# Patient Record
Sex: Male | Born: 2000
Health system: Southern US, Community
[De-identification: ages and names within clinical notes are randomized; demographics above are authoritative.]

## PROBLEM LIST (undated history)

## (undated) DIAGNOSIS — F909 Attention-deficit hyperactivity disorder, unspecified type: Secondary | ICD-10-CM

## (undated) HISTORY — PX: WISDOM TOOTH EXTRACTION: SHX21

---
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
## 2018-05-14 ENCOUNTER — Encounter: Payer: Self-pay | Admitting: Family Medicine

## 2018-05-14 ENCOUNTER — Ambulatory Visit (INDEPENDENT_AMBULATORY_CARE_PROVIDER_SITE_OTHER): Payer: BLUE CROSS/BLUE SHIELD | Admitting: Family Medicine

## 2018-05-14 VITALS — BP 110/70 | HR 76 | Temp 98.4°F | Ht 68.5 in | Wt 182.6 lb

## 2018-05-14 DIAGNOSIS — Z0000 Encounter for general adult medical examination without abnormal findings: Secondary | ICD-10-CM

## 2018-05-14 NOTE — Patient Instructions (Signed)
Consider HPV vaccine-check on insurance coverage and if interested may schedule as nurse visit.  This is a 3 shot series divided -and given over 6 months

## 2018-05-14 NOTE — Progress Notes (Signed)
  Subjective:     Patient ID: Sean CrumbJohn Fouch Schmidt, male   DOB: 10/25/2001, 17 y.o.   MRN: [id number]  HPI Patient here for physical exam. He is preparing to start [employer] for [employer] in the fall. Hopes to major in history. He has no chronic medical problems. Immunizations reviewed. They.are up-to-date with exception of no history of HPV.Marland Kitchen. He will need tetanus booster in a couple of years.  Past Medical History:  Diagnosis Date  . ADHD (attention deficit hyperactivity disorder)    Past Surgical History:  Procedure Laterality Date  . WISDOM TOOTH EXTRACTION Bilateral     reports that he has never smoked. He has never used smokeless tobacco. He reports that he does not drink alcohol or use drugs. family history includes Hypertension in his father and mother. No Known Allergies   Review of Systems  Constitutional: Negative for activity change, appetite change, fatigue and fever.  HENT: Negative for congestion, ear pain and trouble swallowing.   Eyes: Negative for pain and visual disturbance.  Respiratory: Negative for cough, shortness of breath and wheezing.   Cardiovascular: Negative for chest pain and palpitations.  Gastrointestinal: Negative for abdominal distention, abdominal pain, blood in stool, constipation, diarrhea, nausea, rectal pain and vomiting.  Genitourinary: Negative for dysuria, hematuria and testicular pain.  Musculoskeletal: Negative for arthralgias and joint swelling.  Skin: Negative for rash.  Neurological: Negative for dizziness, syncope and headaches.  Hematological: Negative for adenopathy.  Psychiatric/Behavioral: Negative for confusion and dysphoric mood.       Objective:   Physical Exam  Constitutional: He is oriented to person, place, and time. He appears well-developed and well-nourished. No distress.  HENT:  Head: Normocephalic and atraumatic.  Right Ear: External ear normal.  Left Ear: External ear normal.  Mouth/Throat: Oropharynx is clear and  moist.  Eyes: Pupils are equal, round, and reactive to light. Conjunctivae and EOM are normal.  Neck: Normal range of motion. Neck supple. No thyromegaly present.  Cardiovascular: Normal rate, regular rhythm and normal heart sounds.  No murmur heard. Pulmonary/Chest: No respiratory distress. He has no wheezes. He has no rales.  Abdominal: Soft. Bowel sounds are normal. He exhibits no distension and no mass. There is no tenderness. There is no rebound and no guarding.  Musculoskeletal: He exhibits no edema.  Lymphadenopathy:    He has no cervical adenopathy.  Neurological: He is alert and oriented to person, place, and time. He displays normal reflexes. No cranial nerve deficit.  Skin: No rash noted.  Psychiatric: He has a normal mood and affect.       Assessment:     Physical exam. We addressed the following    Plan:     -Immunizations reviewed. We discussed HPV and they will check with insurance and consider getting back for that. -Other immunizations appear up-to-date  Kristian CoveyBruce W Burchette MD [facility]

## 2018-06-02 ENCOUNTER — Telehealth: Payer: Self-pay | Admitting: *Deleted

## 2018-06-02 NOTE — Telephone Encounter (Signed)
Copied from CRM [id number]. Topic: Inquiry >> Jun 02, 2018 12:23 PM Crist InfanteHarrald, Kathy J wrote: Reason for CRM:  dad called because pt's immunization record he received from our office for his Hep B is incorrect. The immunization form he received states these were done 3 days apart.  "Hepatitis B 02/26/2000, 02/23/2000, 08[birth year]12/1999"  BUT the original from his pediatrician states the date were 11/28/1999  and 02/26/2000  The college is not accepting this form, and not allowing him to register, so dad is hoping we can get this corrected asap so that pt can get in [employer] [employer] [facility] [employer].

## 2018-06-02 NOTE — Telephone Encounter (Signed)
Reviewed [facility] and dates are listed as stated below. Do you happen to have a copy of the records from old PCP? There no vaccine records scanned in but I didn't know if maybe they brought records to last appt.

## 2018-06-02 NOTE — Telephone Encounter (Signed)
Immunization record corrected and new record ready to pick up.  Patient is aware.

## 2019-05-27 ENCOUNTER — Telehealth: Payer: Self-pay | Admitting: *Deleted

## 2019-05-27 DIAGNOSIS — Z20822 Contact with and (suspected) exposure to covid-19: Secondary | ICD-10-CM

## 2019-05-27 NOTE — Telephone Encounter (Addendum)
Charted In error; duplicate ----- Message from Hulda Humphrey, [place] sent at 05/26/2019  4:06 PM EDT ----- Regarding: Sean Schmidt would like this pt tested for Covid-19.  He has had exposure.

## 2019-05-27 NOTE — Telephone Encounter (Addendum)
Spoke with pt's father, Bacilio, to schedule COVID testing; he accepted appointment at [facility] site 05/30/2019 at 67 on behalf of the pt; pt's father given address, location, and instructions that he and all occupants of his vehicle should wear masks; he verbalized understanding; orders placed per protocol. ----- Message from Hulda Humphrey, [place] sent at 05/26/2019  4:06 PM EDT ----- Regarding: Jackson Latino would like this pt tested for Covid-19.  He has had exposure.

## 2019-05-30 ENCOUNTER — Other Ambulatory Visit: Payer: BLUE CROSS/BLUE SHIELD

## 2019-05-30 ENCOUNTER — Telehealth: Payer: Self-pay | Admitting: Family Medicine

## 2019-05-30 DIAGNOSIS — Z20822 Contact with and (suspected) exposure to covid-19: Secondary | ICD-10-CM

## 2019-05-30 NOTE — Telephone Encounter (Signed)
Pts father state that he spoke with you and stated that you and Sean Schmidt took care of them with getting them tested for COVID today and pt needs a work note stating that he was under quarantine and when he can return to work.    Pts father would like to have a call when it is ready.

## 2019-05-31 ENCOUNTER — Encounter: Payer: Self-pay | Admitting: Family Medicine

## 2019-05-31 NOTE — Telephone Encounter (Signed)
Dad calling back to ask for the letter stating pt should be quarantined so he can get letter to employer. °Can you put on mychart? ° °

## 2019-05-31 NOTE — Telephone Encounter (Signed)
Spoke to [employer] and advised that I have sent a letter to [employer].  Nothing further needed.

## 2019-06-04 LAB — NOVEL CORONAVIRUS, NAA: SARS-CoV-2, NAA: NOT DETECTED

## 2020-06-26 ENCOUNTER — Ambulatory Visit: Payer: Self-pay | Admitting: Family Medicine

## 2020-06-26 ENCOUNTER — Other Ambulatory Visit: Payer: Self-pay

## 2020-06-26 ENCOUNTER — Encounter: Payer: Self-pay | Admitting: Family Medicine

## 2020-06-26 ENCOUNTER — Ambulatory Visit (INDEPENDENT_AMBULATORY_CARE_PROVIDER_SITE_OTHER): Payer: Managed Care, Other (non HMO) | Admitting: Family Medicine

## 2020-06-26 VITALS — BP 116/62 | HR 67 | Temp 97.9°F | Wt 200.4 lb

## 2020-06-26 DIAGNOSIS — R600 Localized edema: Secondary | ICD-10-CM | POA: Diagnosis not present

## 2020-06-26 DIAGNOSIS — R21 Rash and other nonspecific skin eruption: Secondary | ICD-10-CM | POA: Diagnosis not present

## 2020-06-26 NOTE — Progress Notes (Signed)
Established Patient Office Visit  Subjective:  Patient ID: Sean Schmidt, male    DOB: 2001/03/19  Age: 19 y.o. MRN: [id number]  CC:  Chief Complaint  Patient presents with  . Foot Swelling    in both feet pt thinks from over using at trampoline park sunday     HPI Sean Schmidt presents for the following acute items  He states that Sunday he spent about an hour jumping at a trampoline park.  Monday he woke up and had some bilateral foot edema.  This is mostly along the medial aspect of the foot.  Right greater than left.  No significant pain with ambulation.  Does not recall any specific injury.  No leg edema.  No calf pain.  No recent change of shoewear.  Second issue is scaly rash volar surface left wrist.  Came up several weeks ago.  He tried some type of prescription steroid cream but has not seen much improvement with that.  Moderate pruritus    Past Medical History:  Diagnosis Date  . ADHD (attention deficit hyperactivity disorder)     Past Surgical History:  Procedure Laterality Date  . WISDOM TOOTH EXTRACTION Bilateral     Family History  Problem Relation Age of Onset  . Hypertension Mother   . Hypertension Father     Social History   Socioeconomic History  . Marital status: Single    Spouse name: Not on file  . Number of children: Not on file  . Years of education: Not on file  . Highest education level: Not on file  Occupational History  . Not on file  Tobacco Use  . Smoking status: Never Smoker  . Smokeless tobacco: Never Used  Substance and Sexual Activity  . Alcohol use: No  . Drug use: No  . Sexual activity: Not on file  Other Topics Concern  . Not on file  Social History Narrative  . Not on file   Social Determinants of Health   Financial Resource Strain:   . Difficulty of Paying Living Expenses:   Food Insecurity:   . Worried About Programme researcher, broadcasting/film/video in the Last Year:   . Barista in the Last Year:   Transportation Needs:    . Freight forwarder (Medical):   Marland Kitchen Lack of Transportation (Non-Medical):   Physical Activity:   . Days of Exercise per Week:   . Minutes of Exercise per Session:   Stress:   . Feeling of Stress :   Social Connections:   . Frequency of Communication with Friends and Family:   . Frequency of Social Gatherings with Friends and Family:   . Attends Religious Services:   . Active Member of Clubs or Organizations:   . Attends Banker Meetings:   Marland Kitchen Marital Status:   Intimate Partner Violence:   . Fear of Current or Ex-Partner:   . Emotionally Abused:   Marland Kitchen Physically Abused:   . Sexually Abused:     Outpatient Medications Prior to Visit  Medication Sig Dispense Refill  . Aluminum Chloride POWD Apply to axilla once daily for perspiration control. (Patient not taking: Reported on 06/26/2020) 1 Bottle 0  . azithromycin (ZITHROMAX) 250 MG tablet Take 2 today and then one each day until gone. (Patient not taking: Reported on 06/26/2020) 6 tablet 0   No facility-administered medications prior to visit.    No Known Allergies  ROS Review of Systems  Respiratory: Negative for shortness of  breath.   Cardiovascular: Negative for chest pain.  Skin: Positive for rash.  Neurological: Negative for weakness.      Objective:    Physical Exam Vitals reviewed.  Constitutional:      Appearance: Normal appearance.  Cardiovascular:     Rate and Rhythm: Normal rate and regular rhythm.  Pulmonary:     Effort: Pulmonary effort is normal.     Breath sounds: Normal breath sounds.  Musculoskeletal:     Comments: Feet reveal no bony tenderness.  He has some slight soft tissue prominence on the medial/mid aspect of the right and left feet right greater than left.  No Achilles tenderness.  Full range of motion ankle.  No erythema.  Skin:    Comments: Left wrist volar surface distally has approximately 4 x 4 centimeter area of rash with slightly erythematous base and scaly surface.   Nontender.  Well demarcated border.  Neurological:     Mental Status: He is alert.     BP 116/62 (BP Location: Left Arm, Patient Position: Sitting, Cuff Size: Normal)   Pulse 67   Temp 97.9 F (36.6 C) (Oral)   Wt 200 lb 6.4 oz (90.9 kg)   SpO2 99%   BMI 30.03 kg/m  Wt Readings from Last 3 Encounters:  06/26/20 200 lb 6.4 oz (90.9 kg)  05/14/18 182 lb 9.6 oz (82.8 kg) (85 %, Z= 1.03)*  10/13/17 170 lb 2 oz (77.2 kg) (77 %, Z= 0.73)*   * Growth percentiles are based on CDC (Boys, 2-20 Years) data.     Health Maintenance Due  Topic Date Due  . Hepatitis C Screening  Never done  . HIV Screening  Never done  . INFLUENZA VACCINE  06/24/2020    There are no preventive care reminders to display for this patient.  Lab Results  Component Value Date   TSH 1.42 04/22/2016   Lab Results  Component Value Date   WBC 3.8 (L) 04/22/2016   HGB 14.0 04/22/2016   HCT 42.7 04/22/2016   MCV 84.6 04/22/2016   PLT 190.0 04/22/2016   Lab Results  Component Value Date   NA 140 04/22/2016   K 4.3 04/22/2016   CO2 30 04/22/2016   GLUCOSE 90 04/22/2016   BUN 12 04/22/2016   CREATININE 0.68 04/22/2016   BILITOT 0.8 05/26/2016   ALKPHOS 123 05/26/2016   AST 28 05/26/2016   ALT 25 05/26/2016   PROT 7.4 05/26/2016   ALBUMIN 4.5 05/26/2016   CALCIUM 10.1 04/22/2016   GFR 197.83 04/22/2016   Lab Results  Component Value Date   CHOL 186 04/22/2016   Lab Results  Component Value Date   HDL 47.70 04/22/2016   Lab Results  Component Value Date   LDLCALC 126 (H) 04/22/2016   Lab Results  Component Value Date   TRIG 63.0 04/22/2016   Lab Results  Component Value Date   CHOLHDL 4 04/22/2016   No results found for: HGBA1C    Assessment & Plan:   #1 mild localized foot edema.  This followed trampoline use.  Does not recall specific injury.  No evidence of bony injury  -Recommend he try some icing 15 to 20 minutes 2-3 times daily. -Consider compression wrap -Consider  sports medicine referral and couple weeks if not improving  #2 skin rash left wrist.  Question fungal.  He already tried prescription steroid cream without improvement.  Recommend trial of Lotrimin cream if not resolving over the next few weeks consider scraping and  KOH/culture.  No orders of the defined types were placed in this encounter.   Follow-up: No follow-ups on file.    Evelena Peat, MD

## 2020-06-26 NOTE — Patient Instructions (Signed)
Try some icing 15-20 minutes 2-3 times daily  Could look at some compression also  Let me know in 1-2 weeks if no better in terms of swelling and pain.

## 2020-10-22 ENCOUNTER — Encounter: Payer: Managed Care, Other (non HMO) | Admitting: Family Medicine

## 2020-11-02 ENCOUNTER — Ambulatory Visit (INDEPENDENT_AMBULATORY_CARE_PROVIDER_SITE_OTHER): Payer: PRIVATE HEALTH INSURANCE | Admitting: Family Medicine

## 2020-11-02 ENCOUNTER — Other Ambulatory Visit: Payer: Self-pay

## 2020-11-02 ENCOUNTER — Encounter: Payer: Self-pay | Admitting: Family Medicine

## 2020-11-02 VITALS — BP 110/80 | HR 66 | Temp 98.4°F | Ht 68.5 in | Wt 208.7 lb

## 2020-11-02 DIAGNOSIS — Z0000 Encounter for general adult medical examination without abnormal findings: Secondary | ICD-10-CM

## 2020-11-02 DIAGNOSIS — Z23 Encounter for immunization: Secondary | ICD-10-CM

## 2020-11-02 LAB — HEPATIC FUNCTION PANEL
ALT: 28 U/L (ref 0–53)
AST: 24 U/L (ref 0–37)
Albumin: 4.6 g/dL (ref 3.5–5.2)
Alkaline Phosphatase: 64 U/L (ref 39–117)
Bilirubin, Direct: 0.1 mg/dL (ref 0.0–0.3)
Total Bilirubin: 0.5 mg/dL (ref 0.2–1.2)
Total Protein: 7.4 g/dL (ref 6.0–8.3)

## 2020-11-02 LAB — CBC WITH DIFFERENTIAL/PLATELET
Basophils Absolute: 0 10*3/uL (ref 0.0–0.1)
Basophils Relative: 0.5 % (ref 0.0–3.0)
Eosinophils Absolute: 0.1 10*3/uL (ref 0.0–0.7)
Eosinophils Relative: 1.7 % (ref 0.0–5.0)
HCT: 45.7 % (ref 39.0–52.0)
Hemoglobin: 14.8 g/dL (ref 13.0–17.0)
Lymphocytes Relative: 41.5 % (ref 12.0–46.0)
Lymphs Abs: 1.4 10*3/uL (ref 0.7–4.0)
MCHC: 32.4 g/dL (ref 30.0–36.0)
MCV: 89 fl (ref 78.0–100.0)
Monocytes Absolute: 0.3 10*3/uL (ref 0.1–1.0)
Monocytes Relative: 9.8 % (ref 3.0–12.0)
Neutro Abs: 1.6 10*3/uL (ref 1.4–7.7)
Neutrophils Relative %: 46.5 % (ref 43.0–77.0)
Platelets: 173 10*3/uL (ref 150.0–400.0)
RBC: 5.13 Mil/uL (ref 4.22–5.81)
RDW: 13.3 % (ref 11.5–15.5)
WBC: 3.5 10*3/uL — ABNORMAL LOW (ref 4.0–10.5)

## 2020-11-02 LAB — BASIC METABOLIC PANEL
BUN: 23 mg/dL (ref 6–23)
CO2: 31 mEq/L (ref 19–32)
Calcium: 9.7 mg/dL (ref 8.4–10.5)
Chloride: 102 mEq/L (ref 96–112)
Creatinine, Ser: 0.99 mg/dL (ref 0.40–1.50)
GFR: 108.95 mL/min (ref >60.00)
Glucose, Bld: 95 mg/dL (ref 70–99)
Potassium: 4.3 mEq/L (ref 3.5–5.1)
Sodium: 140 mEq/L (ref 135–145)

## 2020-11-02 LAB — LIPID PANEL
Cholesterol: 180 mg/dL (ref 0–200)
HDL: 57.5 mg/dL (ref >39.00)
LDL Cholesterol: 114 mg/dL — ABNORMAL HIGH (ref 0–99)
NonHDL: 122.61
Total CHOL/HDL Ratio: 3
Triglycerides: 41 mg/dL (ref 0.0–149.0)
VLDL: 8.2 mg/dL (ref 0.0–40.0)

## 2020-11-02 LAB — TSH: TSH: 0.72 u[IU]/mL (ref 0.35–4.50)

## 2020-11-02 NOTE — Addendum Note (Signed)
Addended by: Scarlett Presto on: 11/02/2020 08:11 AM   Modules accepted: Orders

## 2020-11-02 NOTE — Patient Instructions (Signed)

## 2020-11-02 NOTE — Addendum Note (Signed)
Addended by: Lerry Liner on: 11/02/2020 08:09 AM   Modules accepted: Orders

## 2020-11-02 NOTE — Addendum Note (Signed)
Addended by: Lerry Liner on: 11/02/2020 08:14 AM   Modules accepted: Orders

## 2020-11-02 NOTE — Progress Notes (Signed)
Established Patient Office Visit  Subjective:  Patient ID: Sean Schmidt, male    DOB: 02/01/2001  Age: 19 y.o. MRN: [id number]  CC:  Chief Complaint  Patient presents with  . Annual Exam    HPI Sean Schmidt presents for ADD but no lower takes medications for this.  Generally healthy.  He is attending graduate school [employer] studying public history.  He hopes to finish 2023.  Also works at [employer] about 20 to 25 hours/week.  Stays very busy.  Health maintenance reviewed  -No history of flu vaccine this season.  He is willing to consider this. -Due for tetanus at this time -Covid vaccines complete -No history of hepatitis C screening but low risk  Social history-in grad school [employer] as above.  Studying public history.  Occasional vape but infrequently.  Infrequent alcohol use. Not sexually active currently  Family history-both parents have hypertension history.  Sister with asthma history  Past Medical History:  Diagnosis Date  . ADHD (attention deficit hyperactivity disorder)     Past Surgical History:  Procedure Laterality Date  . WISDOM TOOTH EXTRACTION Bilateral     Family History  Problem Relation Age of Onset  . Hypertension Mother   . Hypertension Father   . Asthma Sister     Social History   Socioeconomic History  . Marital status: Single    Spouse name: Not on file  . Number of children: Not on file  . Years of education: Not on file  . Highest education level: Not on file  Occupational History  . Not on file  Tobacco Use  . Smoking status: Never Smoker  . Smokeless tobacco: Never Used  Substance and Sexual Activity  . Alcohol use: No  . Drug use: No  . Sexual activity: Not on file  Other Topics Concern  . Not on file  Social History Narrative  . Not on file   Social Determinants of Health   Financial Resource Strain: Not on file  Food Insecurity: Not on file  Transportation Needs: Not on file  Physical Activity: Not on file   Stress: Not on file  Social Connections: Not on file  Intimate Partner Violence: Not on file    Outpatient Medications Prior to Visit  Medication Sig Dispense Refill  . Aluminum Chloride POWD Apply to axilla once daily for perspiration control. 1 Bottle 0  . azithromycin (ZITHROMAX) 250 MG tablet Take 2 today and then one each day until gone. (Patient not taking: Reported on 06/26/2020) 6 tablet 0   No facility-administered medications prior to visit.    No Known Allergies  ROS Review of Systems  Constitutional: Negative for activity change, appetite change, fatigue, fever and unexpected weight change.  HENT: Negative for congestion, ear pain and trouble swallowing.   Eyes: Negative for pain and visual disturbance.  Respiratory: Negative for cough, shortness of breath and wheezing.   Cardiovascular: Negative for chest pain and palpitations.  Gastrointestinal: Negative for abdominal distention, abdominal pain, blood in stool, constipation, diarrhea, nausea, rectal pain and vomiting.  Endocrine: Negative for polydipsia and polyuria.  Genitourinary: Negative for dysuria, hematuria and testicular pain.  Musculoskeletal: Negative for arthralgias and joint swelling.  Skin: Negative for rash.  Neurological: Negative for dizziness, syncope and headaches.  Hematological: Negative for adenopathy.  Psychiatric/Behavioral: Negative for confusion and dysphoric mood.      Objective:    Physical Exam Constitutional:      General: He is not in acute distress.  Appearance: He is well-developed and well-nourished.  HENT:     Head: Normocephalic and atraumatic.     Right Ear: External ear normal.     Left Ear: External ear normal.     Mouth/Throat:     Mouth: Oropharynx is clear and moist.  Eyes:     Extraocular Movements: EOM normal.     Conjunctiva/sclera: Conjunctivae normal.     Pupils: Pupils are equal, round, and reactive to light.  Neck:     Thyroid: No thyromegaly.   Cardiovascular:     Rate and Rhythm: Normal rate and regular rhythm.     Heart sounds: Normal heart sounds. No murmur heard.   Pulmonary:     Effort: No respiratory distress.     Breath sounds: No wheezing or rales.  Abdominal:     General: Bowel sounds are normal. There is no distension.     Palpations: Abdomen is soft. There is no mass.     Tenderness: There is no abdominal tenderness. There is no guarding or rebound.  Musculoskeletal:        General: No edema.     Cervical back: Normal range of motion and neck supple.  Lymphadenopathy:     Cervical: No cervical adenopathy.  Skin:    Findings: No rash.  Neurological:     Mental Status: He is alert and oriented to person, place, and time.     Cranial Nerves: No cranial nerve deficit.     Deep Tendon Reflexes: Reflexes normal.  Psychiatric:        Mood and Affect: Mood and affect normal.     BP 110/80 (BP Location: Right Arm, Patient Position: Sitting, Cuff Size: Normal)   Pulse 66   Temp 98.4 F (36.9 C) (Oral)   Ht 5' 8.5" (1.74 m)   Wt 208 lb 11.2 oz (94.7 kg)   SpO2 98%   BMI 31.27 kg/m  Wt Readings from Last 3 Encounters:  11/02/20 208 lb 11.2 oz (94.7 kg)  06/26/20 200 lb 6.4 oz (90.9 kg)  05/14/18 182 lb 9.6 oz (82.8 kg) (85 %, Z= 1.03)*   * Growth percentiles are based on CDC (Boys, 2-20 Years) data.     Health Maintenance Due  Topic Date Due  . Hepatitis C Screening  Never done  . HIV Screening  Never done  . TETANUS/TDAP  08/14/2020    There are no preventive care reminders to display for this patient.  Lab Results  Component Value Date   TSH 1.42 04/22/2016   Lab Results  Component Value Date   WBC 3.8 (L) 04/22/2016   HGB 14.0 04/22/2016   HCT 42.7 04/22/2016   MCV 84.6 04/22/2016   PLT 190.0 04/22/2016   Lab Results  Component Value Date   NA 140 04/22/2016   K 4.3 04/22/2016   CO2 30 04/22/2016   GLUCOSE 90 04/22/2016   BUN 12 04/22/2016   CREATININE 0.68 04/22/2016   BILITOT  0.8 05/26/2016   ALKPHOS 123 05/26/2016   AST 28 05/26/2016   ALT 25 05/26/2016   PROT 7.4 05/26/2016   ALBUMIN 4.5 05/26/2016   CALCIUM 10.1 04/22/2016   GFR 197.83 04/22/2016   Lab Results  Component Value Date   CHOL 186 04/22/2016   Lab Results  Component Value Date   HDL 47.70 04/22/2016   Lab Results  Component Value Date   LDLCALC 126 (H) 04/22/2016   Lab Results  Component Value Date   TRIG 63.0 04/22/2016  Lab Results  Component Value Date   CHOLHDL 4 04/22/2016   No results found for: HGBA1C    Assessment & Plan:   Problem List Items Addressed This Visit   None   Visit Diagnoses    Physical exam    -  Primary   Relevant Orders   Basic metabolic panel   Lipid panel   CBC with Differential/Platelet   TSH   Hepatic function panel   Hep C Antibody    Generally healthy 19 year old male.  We discussed several health maintenance issues as follows  -Recommend tetanus booster and flu vaccine -Discussed getting screening labs and will include hepatitis C screen though he is low risk -Recommend more consistent exercise habits -Discussed recommendations to avoid nicotine use altogether  No orders of the defined types were placed in this encounter.   Follow-up: No follow-ups on file.    Evelena Peat, MD

## 2020-11-05 LAB — HEPATITIS C ANTIBODY
Hepatitis C Ab: NONREACTIVE
SIGNAL TO CUT-OFF: 0.02 (ref <1.00)

## 2020-11-30 ENCOUNTER — Other Ambulatory Visit: Payer: Self-pay

## 2020-11-30 ENCOUNTER — Ambulatory Visit: Payer: PRIVATE HEALTH INSURANCE | Admitting: Family Medicine

## 2020-11-30 ENCOUNTER — Encounter: Payer: Self-pay | Admitting: Family Medicine

## 2020-11-30 VITALS — BP 118/82 | HR 74 | Ht 68.5 in | Wt 204.0 lb

## 2020-11-30 DIAGNOSIS — K921 Melena: Secondary | ICD-10-CM

## 2020-11-30 LAB — CBC WITH DIFFERENTIAL/PLATELET
Basophils Absolute: 0 10*3/uL (ref 0.0–0.1)
Basophils Relative: 0.4 % (ref 0.0–3.0)
Eosinophils Absolute: 0 10*3/uL (ref 0.0–0.7)
Eosinophils Relative: 1.7 % (ref 0.0–5.0)
HCT: 43.2 % (ref 39.0–52.0)
Hemoglobin: 14.3 g/dL (ref 13.0–17.0)
Lymphocytes Relative: 55.4 % — ABNORMAL HIGH (ref 12.0–46.0)
Lymphs Abs: 1.6 10*3/uL (ref 0.7–4.0)
MCHC: 33.1 g/dL (ref 30.0–36.0)
MCV: 88 fl (ref 78.0–100.0)
Monocytes Absolute: 0.3 10*3/uL (ref 0.1–1.0)
Monocytes Relative: 11.6 % (ref 3.0–12.0)
Neutro Abs: 0.9 10*3/uL — ABNORMAL LOW (ref 1.4–7.7)
Neutrophils Relative %: 30.9 % — ABNORMAL LOW (ref 43.0–77.0)
Platelets: 165 10*3/uL (ref 150.0–400.0)
RBC: 4.91 Mil/uL (ref 4.22–5.81)
RDW: 13.6 % (ref 11.5–15.5)
WBC: 2.8 10*3/uL — ABNORMAL LOW (ref 4.0–10.5)

## 2020-11-30 NOTE — Progress Notes (Signed)
Established Patient Office Visit  Subjective:  Patient ID: Sean Schmidt, male    DOB: 05-Jul-2001  Age: 20 y.o. MRN: [id number]  CC:  Chief Complaint  Patient presents with  . Hemorrhoids    HPI Sean Schmidt presents with approximately 3 to 4-week history of some bright red blood with wiping after bowel movements.  He does not have any pain with bowel movements.  Denies any straining or constipation.  No recent diarrhea.  No appetite or weight changes.  No abdominal pain.  Does not really notice any major blood in the toilet bowl but this is more just with wiping.  Does not happen consistently each time.  No prior history of similar problem.  Past Medical History:  Diagnosis Date  . ADHD (attention deficit hyperactivity disorder)     Past Surgical History:  Procedure Laterality Date  . WISDOM TOOTH EXTRACTION Bilateral     Family History  Problem Relation Age of Onset  . Hypertension Mother   . Hypertension Father   . Asthma Sister     Social History   Socioeconomic History  . Marital status: Single    Spouse name: Not on file  . Number of children: Not on file  . Years of education: Not on file  . Highest education level: Not on file  Occupational History  . Not on file  Tobacco Use  . Smoking status: Never Smoker  . Smokeless tobacco: Never Used  Substance and Sexual Activity  . Alcohol use: No  . Drug use: No  . Sexual activity: Not on file  Other Topics Concern  . Not on file  Social History Narrative  . Not on file   Social Determinants of Health   Financial Resource Strain: Not on file  Food Insecurity: Not on file  Transportation Needs: Not on file  Physical Activity: Not on file  Stress: Not on file  Social Connections: Not on file  Intimate Partner Violence: Not on file    Outpatient Medications Prior to Visit  Medication Sig Dispense Refill  . Aluminum Chloride POWD Apply to axilla once daily for perspiration control. 1 Bottle 0   No  facility-administered medications prior to visit.    No Known Allergies  ROS Review of Systems  Constitutional: Negative for appetite change and unexpected weight change.  Respiratory: Negative for shortness of breath.   Cardiovascular: Negative for chest pain.  Gastrointestinal: Positive for anal bleeding. Negative for abdominal pain, blood in stool, constipation, diarrhea, nausea and vomiting.  Neurological: Negative for dizziness.      Objective:    Physical Exam Vitals reviewed.  Constitutional:      Appearance: Normal appearance.  Cardiovascular:     Rate and Rhythm: Normal rate and regular rhythm.  Pulmonary:     Effort: Pulmonary effort is normal.     Breath sounds: Normal breath sounds.  Genitourinary:    Comments: Rectal exam reveals no external hemorrhoids.  No anal fissure.  Digital exam reveals no mass.  Hemoccult negative.  Anoscope exam reveals some small internal hemorrhoids but no active bleeding.  No other abnormalities noted. Neurological:     Mental Status: He is alert.     BP 118/82   Pulse 74   Ht 5' 8.5" (1.74 m)   Wt 204 lb (92.5 kg)   SpO2 98%   BMI 30.57 kg/m  Wt Readings from Last 3 Encounters:  11/30/20 204 lb (92.5 kg)  11/02/20 208 lb 11.2 oz (94.7 kg)  06/26/20 200 lb 6.4 oz (90.9 kg)     Health Maintenance Due  Topic Date Due  . HIV Screening  Never done    There are no preventive care reminders to display for this patient.  Lab Results  Component Value Date   TSH 0.72 11/02/2020   Lab Results  Component Value Date   WBC 3.5 (L) 11/02/2020   HGB 14.8 11/02/2020   HCT 45.7 11/02/2020   MCV 89.0 11/02/2020   PLT 173.0 11/02/2020   Lab Results  Component Value Date   NA 140 11/02/2020   K 4.3 11/02/2020   CO2 31 11/02/2020   GLUCOSE 95 11/02/2020   BUN 23 11/02/2020   CREATININE 0.99 11/02/2020   BILITOT 0.5 11/02/2020   ALKPHOS 64 11/02/2020   AST 24 11/02/2020   ALT 28 11/02/2020   PROT 7.4 11/02/2020    ALBUMIN 4.6 11/02/2020   CALCIUM 9.7 11/02/2020   GFR 108.95 11/02/2020   Lab Results  Component Value Date   CHOL 180 11/02/2020   Lab Results  Component Value Date   HDL 57.50 11/02/2020   Lab Results  Component Value Date   LDLCALC 114 (H) 11/02/2020   Lab Results  Component Value Date   TRIG 41.0 11/02/2020   Lab Results  Component Value Date   CHOLHDL 3 11/02/2020   No results found for: HGBA1C    Assessment & Plan:   Patient presents with approximately 1 month history of intermittent hematochezia with bright red blood per rectum with wiping after bowel movements.  No evidence for external hemorrhoids.  No anal fissure.  Does have some small internal hemorrhoids but no active bleeding at this time.  No other masses or other abnormalities noted.  -Check CBC -Plenty of fluids and fiber in measures to avoid straining -Observe for another few weeks.  If persistent at that time consider GI referral  No orders of the defined types were placed in this encounter.   Follow-up: No follow-ups on file.    Evelena Peat, MD

## 2020-11-30 NOTE — Progress Notes (Signed)
Mychart message sent: Hemoglobin normal.  His white count is chronically low-but doubt clinically significant.  Would recommend recheck CBC in 3 months to assess stability of white count

## 2020-11-30 NOTE — Patient Instructions (Signed)
Rectal Bleeding  Rectal bleeding is when blood passes out of the anus. People with rectal bleeding may notice bright red blood in their underwear or in the toilet after having a bowel movement. They may also have dark red or black stools. Rectal bleeding is usually a sign that something is wrong. Many things can cause rectal bleeding, including:  Hemorrhoids. These are blood vessels in the anus or rectum that are larger than normal.  Fistulas. These are abnormal passages in the rectum and anus.  Anal fissures. This is a tear in the anus.  Diverticulosis. This is a condition in which pockets or sacs project from the bowel.  Proctitis and colitis. These are conditions in which the rectum, colon, or anus become inflamed.  Polyps. These are growths that can be cancerous (malignant) or non-cancerous (benign).  Part of the rectum sticking out from the anus (rectal prolapse).  Certain medicines.  Intestinal infections. Follow these instructions at home: Pay attention to any changes in your symptoms. Take these actions to help lessen bleeding and discomfort:  Eat a diet that is high in fiber. This will keep your stool soft, making it easier to pass stools without straining. Ask your health care provider what foods and drinks are high in fiber.  Drink enough fluid to keep your urine clear or pale yellow. This also helps to keep your stool soft.  Try taking a warm bath. This may help soothe any pain in your rectum.  Keep all follow-up visits as told by your health care provider. This is important. Get help right away if:  You have new or increased rectal bleeding.  You have black or dark red stools.  You vomit blood or something that looks like coffee grounds.  You have pain or tenderness in your abdomen.  You have a fever.  You feel weak.  You feel nauseous.  You faint.  You have severe pain in your rectum.  You cannot have a bowel movement. This information is not  intended to replace advice given to you by your health care provider. Make sure you discuss any questions you have with your health care provider. Document Revised: 07/03/2016 Document Reviewed: 01/06/2016 Elsevier Patient Education  2020 Elsevier Inc.  No evidence for anal fissure or external hemorrhoids.  You have some small internal hemorrhoids but no evidence for active bleeding at this time.  Drink plenty of fluids and eat lots of fiber to avoid any straining.  If still having any blood with wiping in 3 to 4 weeks let me know and would recommend GI referral at that point

## 2021-02-10 ENCOUNTER — Other Ambulatory Visit: Payer: Self-pay

## 2021-02-10 ENCOUNTER — Emergency Department
Admission: RE | Admit: 2021-02-10 | Discharge: 2021-02-10 | Disposition: A | Discharge status: Home / Self Care | Payer: PRIVATE HEALTH INSURANCE | Source: Ambulatory Visit

## 2021-02-10 VITALS — BP 129/66 | HR 97 | Temp 99.5°F

## 2021-02-10 DIAGNOSIS — R6889 Other general symptoms and signs: Secondary | ICD-10-CM

## 2021-02-10 DIAGNOSIS — R112 Nausea with vomiting, unspecified: Secondary | ICD-10-CM

## 2021-02-10 MED ORDER — ONDANSETRON 8 MG PO TBDP: 8.0000 mg | ORAL_TABLET | Freq: Three times a day (TID) | ORAL | 0 refills | Status: DC | PRN

## 2021-02-10 NOTE — ED Triage Notes (Signed)
Patient c/o fever since Thursday, sore throat, headache, cough, vomiting, diarrhea.  Patient is taken Tylenol.  Highest temp of 103.  Patient did take a rapid COVID test last night, negative, PCR on Friday, no results yet.  PCR was done at [facility].  Patient is fully vaccinated.

## 2021-02-10 NOTE — Discharge Instructions (Signed)
Drink clear liquids, beef, chicken, vegetable broth, Pedialyte, or Gatorade (low sugar). Eat crackers/toast, advance diet as tolerated.

## 2021-02-10 NOTE — ED Provider Notes (Signed)
[facility]    CSN: [id number] Arrival date & time: 02/10/21  6294      History   Chief Complaint Chief Complaint  Patient presents with  . Appointment    fever    HPI Sean Schmidt is a 20 y.o. male.   Patient here c/w fever x 4 days, tmax 103 on Thursday, progressively better, now 99.5 without use of any fever reducing medications this morning.  Admits cough, sore thraot, nausea, vomiting x 1,  diarrhea.  deneis otalgia, nasal congestion, wheezing, SOB.  Negative home COVID test yesterday, PCR at [facility] pending.     Past Medical History:  Diagnosis Date  . ADHD (attention deficit hyperactivity disorder)     Patient Active Problem List   Diagnosis Date Noted  . Allergic reaction 10/13/2017  . Cellulitis of left axilla 10/13/2017  . ADHD (attention deficit hyperactivity disorder) 08/02/2013    Past Surgical History:  Procedure Laterality Date  . WISDOM TOOTH EXTRACTION Bilateral        Home Medications    Prior to Admission medications   Medication Sig Start Date End Date Taking? Authorizing Provider  ondansetron (ZOFRAN-ODT) 8 MG disintegrating tablet Take 1 tablet (8 mg total) by mouth every 8 (eight) hours as needed for nausea or vomiting. 02/10/21  Yes Evern Core, PA-C  Aluminum Chloride POWD Apply to axilla once daily for perspiration control. 10/13/17   Mliss Sax, MD    Family History Family History  Problem Relation Age of Onset  . Hypertension Mother   . Hypertension Father   . Asthma Sister     Social History Social History   Tobacco Use  . Smoking status: Never Smoker  . Smokeless tobacco: Never Used  Substance Use Topics  . Alcohol use: No  . Drug use: No     Allergies   Patient has no known allergies.   Review of Systems Review of Systems  Constitutional: Positive for chills, fatigue and fever.  HENT: Positive for congestion and sore throat. Negative for ear pain, nosebleeds, postnasal drip,  rhinorrhea, sinus pressure and sinus pain.   Eyes: Negative for pain and redness.  Respiratory: Positive for cough. Negative for shortness of breath and wheezing.   Gastrointestinal: Positive for diarrhea, nausea and vomiting. Negative for abdominal pain.  Musculoskeletal: Positive for myalgias. Negative for arthralgias.  Skin: Negative for rash.  Neurological: Positive for headaches. Negative for light-headedness.  Hematological: Negative for adenopathy. Does not bruise/bleed easily.  Psychiatric/Behavioral: Negative for confusion and sleep disturbance.     Physical Exam Triage Vital Signs ED Triage Vitals  Enc Vitals Group     BP 02/10/21 1013 129/66     Pulse Rate 02/10/21 1013 97     Resp --      Temp 02/10/21 1013 99.5 F (37.5 C)     Temp Source 02/10/21 1013 Oral     SpO2 02/10/21 1013 97 %     Weight --      Height --      Head Circumference --      Peak Flow --      Pain Score 02/10/21 1014 6     Pain Loc --      Pain Edu? --      Excl. in GC? --    No data found.  Updated Vital Signs BP 129/66 (BP Location: Left Arm)   Pulse 97   Temp 99.5 F (37.5 C) (Oral)   SpO2 97%   Visual  Acuity Right Eye Distance:   Left Eye Distance:   Bilateral Distance:    Right Eye Near:   Left Eye Near:    Bilateral Near:     Physical Exam Vitals and nursing note reviewed.  Constitutional:      General: He is not in acute distress.    Appearance: Normal appearance. He is not ill-appearing, toxic-appearing or diaphoretic.  HENT:     Head: Normocephalic and atraumatic.     Right Ear: Tympanic membrane and ear canal normal.     Left Ear: Tympanic membrane and ear canal normal.     Nose: No congestion or rhinorrhea.     Mouth/Throat:     Pharynx: No oropharyngeal exudate or posterior oropharyngeal erythema.     Tonsils: No tonsillar exudate or tonsillar abscesses. 0 on the right. 0 on the left.  Eyes:     General: No scleral icterus.    Extraocular Movements:  Extraocular movements intact.     Conjunctiva/sclera: Conjunctivae normal.  Cardiovascular:     Rate and Rhythm: Normal rate and regular rhythm.     Heart sounds: No murmur heard.   Pulmonary:     Effort: Pulmonary effort is normal. No respiratory distress.     Breath sounds: Normal breath sounds. No wheezing or rales.  Musculoskeletal:     Cervical back: Normal range of motion. No rigidity.  Lymphadenopathy:     Cervical:     Right cervical: No superficial, deep or posterior cervical adenopathy.    Left cervical: No superficial, deep or posterior cervical adenopathy.  Skin:    Capillary Refill: Capillary refill takes less than 2 seconds.     Coloration: Skin is not jaundiced.     Findings: No rash.  Neurological:     General: No focal deficit present.     Mental Status: He is alert and oriented to person, place, and time.     Motor: No weakness.     Gait: Gait normal.  Psychiatric:        Mood and Affect: Mood normal.        Behavior: Behavior normal.      UC Treatments / Results  Labs (all labs ordered are listed, but only abnormal results are displayed) Labs Reviewed - No data to display  EKG   Radiology No results found.  Procedures Procedures (including critical care time)  Medications Ordered in UC Medications - No data to display  Initial Impression / Assessment and Plan / UC Course  I have reviewed the triage vital signs and the nursing notes.  Pertinent labs & imaging results that were available during my care of the patient were reviewed by me and considered in my medical decision making (see chart for details).     Take medication as prescribed Drink plenty of fluids, get plenty of rest  Follow up with PCP if no improvement, return with new or worsening symptoms. Final Clinical Impressions(s) / UC Diagnoses   Final diagnoses:  Non-intractable vomiting with nausea, unspecified vomiting type  Flu-like symptoms     Discharge Instructions      Drink clear liquids, beef, chicken, vegetable broth, Pedialyte, or Gatorade (low sugar). Eat crackers/toast, advance diet as tolerated.   ED Prescriptions    Medication Sig Dispense Auth. Provider   ondansetron (ZOFRAN-ODT) 8 MG disintegrating tablet Take 1 tablet (8 mg total) by mouth every 8 (eight) hours as needed for nausea or vomiting. 12 tablet Evern Core, PA-C     PDMP not  reviewed this encounter.   Arlan, Birks, PA-C 02/10/21 1044

## 2021-02-11 ENCOUNTER — Encounter: Payer: Self-pay | Admitting: Family Medicine

## 2021-02-11 ENCOUNTER — Telehealth (INDEPENDENT_AMBULATORY_CARE_PROVIDER_SITE_OTHER): Payer: Managed Care, Other (non HMO) | Admitting: Family Medicine

## 2021-02-11 VITALS — Temp 100.8°F

## 2021-02-11 DIAGNOSIS — J069 Acute upper respiratory infection, unspecified: Secondary | ICD-10-CM

## 2021-02-11 NOTE — Patient Instructions (Signed)
i

## 2021-02-11 NOTE — Progress Notes (Signed)
Virtual Visit via Video Note  I connected with Sean Schmidt on 02/11/21 at  4:00 PM EDT by a video enabled telemedicine application 2/2 COVID-19 pandemic and verified that I am speaking with the correct person using two identifiers.  Location patient: home Location provider:work or home office Persons participating in the virtual visit: patient, provider  I discussed the limitations of evaluation and management by telemedicine and the availability of in person appointments. The patient expressed understanding and agreed to proceed.   HPI: Pt is a 20 yo male with pmh sig for ADHD followed by Dr. Caryl Never who was seen for acute concern.  Pt with fever T max, 103 F on Thurs, body aches, HA, N, sore throat, dry cough.  Denies ear pain/pressure, facial pain, pressure, or sick contacts.   Pt had a negative COVID test.  Pt went to UC on 02/11/21, given zofran.  Rapid flu test was not available.  Pt given a note for work with return on Wed, however requesting another note as is in [employer] school and works in the Surveyor, mining at [employer].  Tried ibuprofen and Zofran for symptoms.  ROS: See pertinent positives and negatives per HPI.  Past Medical History:  Diagnosis Date  . ADHD (attention deficit hyperactivity disorder)     Past Surgical History:  Procedure Laterality Date  . WISDOM TOOTH EXTRACTION Bilateral     Family History  Problem Relation Age of Onset  . Hypertension Mother   . Hypertension Father   . Asthma Sister      Current Outpatient Medications:  .  ondansetron (ZOFRAN-ODT) 8 MG disintegrating tablet, Take 1 tablet (8 mg total) by mouth every 8 (eight) hours as needed for nausea or vomiting., Disp: 12 tablet, Rfl: 0 .  Aluminum Chloride POWD, Apply to axilla once daily for perspiration control. (Patient not taking: Reported on 02/11/2021), Disp: 1 Bottle, Rfl: 0  EXAM:  VITALS per patient if applicable: RR between 12-20 bpm  GENERAL: alert, oriented, appears well and in no  acute distress  HEENT: atraumatic, conjunctiva clear, no obvious abnormalities on inspection of external nose and ears  NECK: normal movements of the head and neck  LUNGS: on inspection no signs of respiratory distress, breathing rate appears normal, no obvious gross SOB, gasping or wheezing  CV: no obvious cyanosis  MS: moves all visible extremities without noticeable abnormality  PSYCH/NEURO: pleasant and cooperative, no obvious depression or anxiety, speech and thought processing grossly intact  ASSESSMENT AND PLAN:  Discussed the following assessment and plan:  Viral URI with cough -Discussed possible causes including acute nasopharyngitis versus influenza virus -given duration of symptoms antiviral not indicated. -COVID testing negative -continue supportive care including fluids, Tylenol, rest, etc. -pt advised to remain out of work until resolution of symptoms.  Given a note for work. -given precautions  F/u with pcp prn  I discussed the assessment and treatment plan with the patient. The patient was provided an opportunity to ask questions and all were answered. The patient agreed with the plan and demonstrated an understanding of the instructions.   The patient was advised to call back or seek an in-person evaluation if the symptoms worsen or if the condition fails to improve as anticipated.  Deeann Saint, MD

## 2021-11-04 ENCOUNTER — Ambulatory Visit (INDEPENDENT_AMBULATORY_CARE_PROVIDER_SITE_OTHER): Payer: No Typology Code available for payment source | Admitting: Family Medicine

## 2021-11-04 VITALS — BP 120/74 | HR 98 | Temp 97.9°F | Ht 68.5 in | Wt 235.9 lb

## 2021-11-04 DIAGNOSIS — Z0000 Encounter for general adult medical examination without abnormal findings: Secondary | ICD-10-CM

## 2021-11-04 DIAGNOSIS — Z23 Encounter for immunization: Secondary | ICD-10-CM

## 2021-11-04 LAB — BASIC METABOLIC PANEL
BUN: 11 mg/dL (ref 6–23)
CO2: 28 mEq/L (ref 19–32)
Calcium: 9.6 mg/dL (ref 8.4–10.5)
Chloride: 103 mEq/L (ref 96–112)
Creatinine, Ser: 1.05 mg/dL (ref 0.40–1.50)
GFR: 100.8 mL/min (ref >60.00)
Glucose, Bld: 78 mg/dL (ref 70–99)
Potassium: 4 mEq/L (ref 3.5–5.1)
Sodium: 142 mEq/L (ref 135–145)

## 2021-11-04 LAB — LIPID PANEL
Cholesterol: 184 mg/dL (ref 0–200)
HDL: 41.6 mg/dL (ref >39.00)
LDL Cholesterol: 116 mg/dL — ABNORMAL HIGH (ref 0–99)
NonHDL: 142.88
Total CHOL/HDL Ratio: 4
Triglycerides: 134 mg/dL (ref 0.0–149.0)
VLDL: 26.8 mg/dL (ref 0.0–40.0)

## 2021-11-04 LAB — HEPATIC FUNCTION PANEL
ALT: 51 U/L (ref 0–53)
AST: 37 U/L (ref 0–37)
Albumin: 4.5 g/dL (ref 3.5–5.2)
Alkaline Phosphatase: 57 U/L (ref 39–117)
Bilirubin, Direct: 0.1 mg/dL (ref 0.0–0.3)
Total Bilirubin: 0.7 mg/dL (ref 0.2–1.2)
Total Protein: 7.9 g/dL (ref 6.0–8.3)

## 2021-11-04 LAB — CBC WITH DIFFERENTIAL/PLATELET
Basophils Absolute: 0 10*3/uL (ref 0.0–0.1)
Basophils Relative: 0.5 % (ref 0.0–3.0)
Eosinophils Absolute: 0.1 10*3/uL (ref 0.0–0.7)
Eosinophils Relative: 1.7 % (ref 0.0–5.0)
HCT: 44.2 % (ref 39.0–52.0)
Hemoglobin: 14.3 g/dL (ref 13.0–17.0)
Lymphocytes Relative: 35.5 % (ref 12.0–46.0)
Lymphs Abs: 1.4 10*3/uL (ref 0.7–4.0)
MCHC: 32.3 g/dL (ref 30.0–36.0)
MCV: 87.1 fl (ref 78.0–100.0)
Monocytes Absolute: 0.3 10*3/uL (ref 0.1–1.0)
Monocytes Relative: 8.9 % (ref 3.0–12.0)
Neutro Abs: 2 10*3/uL (ref 1.4–7.7)
Neutrophils Relative %: 53.4 % (ref 43.0–77.0)
Platelets: 197 10*3/uL (ref 150.0–400.0)
RBC: 5.08 Mil/uL (ref 4.22–5.81)
RDW: 13.7 % (ref 11.5–15.5)
WBC: 3.8 10*3/uL — ABNORMAL LOW (ref 4.0–10.5)

## 2021-11-04 LAB — TSH: TSH: 0.65 u[IU]/mL (ref 0.35–5.50)

## 2021-11-04 LAB — VITAMIN D 25 HYDROXY (VIT D DEFICIENCY, FRACTURES): VITD: 17.24 ng/mL — ABNORMAL LOW (ref 30.00–100.00)

## 2021-11-04 NOTE — Progress Notes (Signed)
Established Patient Office Visit  Subjective:  Patient ID: Sean Schmidt, male    DOB: Apr 07, 2001  Age: 20 y.o. MRN: [id number]  CC:  Chief Complaint  Patient presents with   Annual Exam    HPI Sean Schmidt presents for physical exam.  He has no significant chronic medical problems.  Takes no regular medications.  He finishes grad school in May.  He studies public history.  He is very excited about finishing school soon.  Generally doing well.  He has gained some weight during this past year and knows he needs to tighten up his diet and get more exercise.  He does keep a very busy schedule with working and school.  Has not had flu vaccine.  Tetanus was boosted last year.  Social history-he is single.  In grad school [employer] for public history.  Infrequent vaping.  No regular alcohol use. He does have a steady girlfriend  Family history-Sister with asthma.  Both parents have hypertension history.  No history of premature CAD  Past Medical History:  Diagnosis Date   ADHD (attention deficit hyperactivity disorder)     Past Surgical History:  Procedure Laterality Date   WISDOM TOOTH EXTRACTION Bilateral     Family History  Problem Relation Age of Onset   Hypertension Mother    Hypertension Father    Asthma Sister     Social History   Socioeconomic History   Marital status: Single    Spouse name: Not on file   Number of children: Not on file   Years of education: Not on file   Highest education level: Not on file  Occupational History   Not on file  Tobacco Use   Smoking status: Never   Smokeless tobacco: Never  Substance and Sexual Activity   Alcohol use: No   Drug use: No   Sexual activity: Not on file  Other Topics Concern   Not on file  Social History Narrative   Not on file   Social Determinants of Health   Financial Resource Strain: Not on file  Food Insecurity: Not on file  Transportation Needs: Not on file  Physical Activity: Not on file   Stress: Not on file  Social Connections: Not on file  Intimate Partner Violence: Not on file    Outpatient Medications Prior to Visit  Medication Sig Dispense Refill   Aluminum Chloride POWD Apply to axilla once daily for perspiration control. 1 Bottle 0   ondansetron (ZOFRAN-ODT) 8 MG disintegrating tablet Take 1 tablet (8 mg total) by mouth every 8 (eight) hours as needed for nausea or vomiting. 12 tablet 0   No facility-administered medications prior to visit.    No Known Allergies  ROS Review of Systems  Constitutional:  Negative for activity change, appetite change, fatigue and fever.  HENT:  Negative for congestion, ear pain and trouble swallowing.   Eyes:  Negative for pain and visual disturbance.  Respiratory:  Negative for cough, shortness of breath and wheezing.   Cardiovascular:  Negative for chest pain and palpitations.  Gastrointestinal:  Negative for abdominal distention, abdominal pain, blood in stool, constipation, diarrhea, nausea, rectal pain and vomiting.  Genitourinary:  Negative for dysuria, hematuria and testicular pain.  Musculoskeletal:  Negative for arthralgias and joint swelling.  Skin:  Negative for rash.  Neurological:  Negative for dizziness, syncope and headaches.  Hematological:  Negative for adenopathy.  Psychiatric/Behavioral:  Negative for confusion and dysphoric mood.      Objective:  Physical Exam Constitutional:      General: He is not in acute distress.    Appearance: He is well-developed.  HENT:     Head: Normocephalic and atraumatic.     Right Ear: External ear normal.     Left Ear: External ear normal.  Eyes:     Conjunctiva/sclera: Conjunctivae normal.     Pupils: Pupils are equal, round, and reactive to light.  Neck:     Thyroid: No thyromegaly.  Cardiovascular:     Rate and Rhythm: Normal rate and regular rhythm.     Heart sounds: Normal heart sounds. No murmur heard. Pulmonary:     Effort: No respiratory distress.      Breath sounds: No wheezing or rales.  Abdominal:     General: Bowel sounds are normal. There is no distension.     Palpations: Abdomen is soft. There is no mass.     Tenderness: There is no abdominal tenderness. There is no guarding or rebound.  Musculoskeletal:     Cervical back: Normal range of motion and neck supple.  Lymphadenopathy:     Cervical: No cervical adenopathy.  Skin:    Findings: No rash.  Neurological:     Mental Status: He is alert and oriented to person, place, and time.     Cranial Nerves: No cranial nerve deficit.    BP 120/74 (BP Location: Left Arm, Patient Position: Sitting, Cuff Size: Normal)   Pulse 98   Temp 97.9 F (36.6 C) (Oral)   Ht 5' 8.5" (1.74 m)   Wt 235 lb 14.4 oz (107 kg)   SpO2 98%   BMI 35.35 kg/m  Wt Readings from Last 3 Encounters:  11/04/21 235 lb 14.4 oz (107 kg)  11/30/20 204 lb (92.5 kg)  11/02/20 208 lb 11.2 oz (94.7 kg)     Health Maintenance Due  Topic Date Due   HIV Screening  Never done   COVID-19 Vaccine (3 - Booster) 05/18/2020   INFLUENZA VACCINE  06/24/2021    There are no preventive care reminders to display for this patient.  Lab Results  Component Value Date   TSH 0.72 11/02/2020   Lab Results  Component Value Date   WBC 2.8 (L) 11/30/2020   HGB 14.3 11/30/2020   HCT 43.2 11/30/2020   MCV 88.0 11/30/2020   PLT 165.0 11/30/2020   Lab Results  Component Value Date   NA 140 11/02/2020   K 4.3 11/02/2020   CO2 31 11/02/2020   GLUCOSE 95 11/02/2020   BUN 23 11/02/2020   CREATININE 0.99 11/02/2020   BILITOT 0.5 11/02/2020   ALKPHOS 64 11/02/2020   AST 24 11/02/2020   ALT 28 11/02/2020   PROT 7.4 11/02/2020   ALBUMIN 4.6 11/02/2020   CALCIUM 9.7 11/02/2020   GFR 108.95 11/02/2020   Lab Results  Component Value Date   CHOL 180 11/02/2020   Lab Results  Component Value Date   HDL 57.50 11/02/2020   Lab Results  Component Value Date   LDLCALC 114 (H) 11/02/2020   Lab Results  Component  Value Date   TRIG 41.0 11/02/2020   Lab Results  Component Value Date   CHOLHDL 3 11/02/2020   No results found for: HGBA1C    Assessment & Plan:   Problem List Items Addressed This Visit   None Visit Diagnoses     Physical exam    -  Primary   Relevant Orders   Basic metabolic panel   Lipid panel  CBC with Differential/Platelet   TSH   Hepatic function panel   VITAMIN D 25 Hydroxy (Vit-D Deficiency, Fractures)     -Flu vaccine recommended and patient consents -Patient requesting vitamin D level.  This will be added to his labs as above -We have recommended he try to establish more consistent exercise and lose some weight -Strongly recommend he stop vaping completely even though he does this infrequently currently  No orders of the defined types were placed in this encounter.   Follow-up: No follow-ups on file.    Evelena Peat, MD

## 2021-11-19 ENCOUNTER — Ambulatory Visit (HOSPITAL_COMMUNITY)
Admission: EM | Admit: 2021-11-19 | Discharge: 2021-11-19 | Disposition: A | Discharge status: Home / Self Care | Payer: No Typology Code available for payment source | Attending: Physician Assistant | Admitting: Physician Assistant

## 2021-11-19 ENCOUNTER — Encounter (HOSPITAL_COMMUNITY): Payer: Self-pay | Admitting: Emergency Medicine

## 2021-11-19 ENCOUNTER — Other Ambulatory Visit: Payer: Self-pay

## 2021-11-19 DIAGNOSIS — M25512 Pain in left shoulder: Secondary | ICD-10-CM | POA: Diagnosis not present

## 2021-11-19 MED ORDER — CYCLOBENZAPRINE HCL 10 MG PO TABS: 10.0000 mg | ORAL_TABLET | Freq: Two times a day (BID) | ORAL | 0 refills | Status: DC | PRN

## 2021-11-19 MED ORDER — NAPROXEN 500 MG PO TABS: 500.0000 mg | ORAL_TABLET | Freq: Two times a day (BID) | ORAL | 0 refills | Status: DC
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
## 2021-12-03 ENCOUNTER — Ambulatory Visit: Payer: No Typology Code available for payment source | Admitting: Family Medicine

## 2021-12-03 VITALS — BP 130/80 | HR 80 | Temp 98.1°F | Wt 249.9 lb

## 2021-12-03 DIAGNOSIS — S29012A Strain of muscle and tendon of back wall of thorax, initial encounter: Secondary | ICD-10-CM

## 2021-12-03 NOTE — Patient Instructions (Signed)
Suspect muscle strain  Try the Naproxen one twice daily which I think will help  Be in touch in 1-2 weeks if not better.
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
## 2022-04-30 ENCOUNTER — Ambulatory Visit: Payer: No Typology Code available for payment source | Admitting: Family Medicine

## 2022-05-05 ENCOUNTER — Encounter: Payer: Self-pay | Admitting: Family Medicine

## 2022-05-05 ENCOUNTER — Ambulatory Visit (INDEPENDENT_AMBULATORY_CARE_PROVIDER_SITE_OTHER): Payer: No Typology Code available for payment source | Admitting: Family Medicine

## 2022-05-05 VITALS — BP 120/80 | HR 70 | Temp 97.7°F | Ht 68.5 in | Wt 248.2 lb

## 2022-05-05 DIAGNOSIS — K921 Melena: Secondary | ICD-10-CM | POA: Diagnosis not present

## 2022-05-05 MED ORDER — HYDROCORTISONE ACETATE 25 MG RE SUPP: 25.0000 mg | Freq: Two times a day (BID) | RECTAL | 0 refills | Status: DC

## 2022-05-05 NOTE — Patient Instructions (Signed)
Let me know if you are having any rectal bleeding after trying the suppositories.

## 2022-05-05 NOTE — Progress Notes (Signed)
   Established Patient Office Visit  Subjective   Patient ID: Sean Schmidt, male    DOB: 06-08-2001  Age: 21 y.o. MRN: [id number]  Chief Complaint  Patient presents with   Hemorrhoids    HPI   Here with possible "hemorrhoids ".  He had similar type episode about a year and a half ago which eventually ceased.  He denies any constipation.  He has noticed some recent occasional bright red blood with wiping.  No diarrhea symptoms.  No appetite changes.  Has had some weight gain.  No pain with bowel movements.  Bright red blood is only with wiping.  Past Medical History:  Diagnosis Date   ADHD (attention deficit hyperactivity disorder)    Past Surgical History:  Procedure Laterality Date   WISDOM TOOTH EXTRACTION Bilateral     reports that he has never smoked. He has never used smokeless tobacco. He reports that he does not drink alcohol and does not use drugs. family history includes Asthma in his sister; Hypertension in his father and mother. No Known Allergies  Review of Systems  Constitutional:  Negative for weight loss.  Gastrointestinal:  Negative for abdominal pain, diarrhea and melena.  Neurological:  Negative for dizziness.      Objective:     BP 120/80 (BP Location: Left Arm, Patient Position: Sitting, Cuff Size: Normal)   Pulse 70   Temp 97.7 F (36.5 C) (Oral)   Ht 5' 8.5" (1.74 m)   Wt 248 lb 3.2 oz (112.6 kg)   SpO2 98%   BMI 37.19 kg/m  Wt Readings from Last 3 Encounters:  05/05/22 248 lb 3.2 oz (112.6 kg)  12/03/21 249 lb 14.4 oz (113.4 kg)  11/04/21 235 lb 14.4 oz (107 kg)      Physical Exam Vitals reviewed.  Constitutional:      Appearance: Normal appearance.  Cardiovascular:     Rate and Rhythm: Normal rate and regular rhythm.  Pulmonary:     Effort: Pulmonary effort is normal.     Breath sounds: Normal breath sounds.  Genitourinary:    Rectum: Normal.     Comments: No external hemorrhoids.  No visible anal fissure.  Digital exam reveals  no mass.  Brown stool trace heme positive Neurological:     Mental Status: He is alert.      No results found for any visits on 05/05/22.  Last CBC Lab Results  Component Value Date   WBC 3.8 (L) 11/04/2021   HGB 14.3 11/04/2021   HCT 44.2 11/04/2021   MCV 87.1 11/04/2021   RDW 13.7 11/04/2021   PLT 197.0 11/04/2021      The ASCVD Risk score (Arnett DK, et al., 2019) failed to calculate for the following reasons:   The 2019 ASCVD risk score is only valid for ages 89 to 33    Assessment & Plan:   Problem List Items Addressed This Visit   None Visit Diagnoses     Hematochezia    -  Primary     Presents with some bright red blood with wiping.  No evidence for external hemorrhoid or fissure.  Suspect internal hemorrhoids.  Has had these in the past.  We discussed measures to reduce straining.  Consider Anusol HC suppositories 25 mg twice daily for the next week.  If symptoms not fully resolved in 2 weeks to be in touch and we will set up GI referral  No follow-ups on file.    Evelena Peat, MD

## 2023-09-22 ENCOUNTER — Ambulatory Visit
Admission: EM | Admit: 2023-09-22 | Discharge: 2023-09-22 | Disposition: A | Discharge status: Home / Self Care | Payer: No Typology Code available for payment source | Attending: Family Medicine | Admitting: Family Medicine

## 2023-09-22 DIAGNOSIS — R030 Elevated blood-pressure reading, without diagnosis of hypertension: Secondary | ICD-10-CM | POA: Diagnosis not present

## 2023-09-22 DIAGNOSIS — L02411 Cutaneous abscess of right axilla: Secondary | ICD-10-CM | POA: Diagnosis not present

## 2023-09-22 MED ORDER — SULFAMETHOXAZOLE-TRIMETHOPRIM 800-160 MG PO TABS
1.0000 | ORAL_TABLET | Freq: Two times a day (BID) | ORAL | 0 refills | Status: AC
Start: 2023-09-22 — End: 2023-10-02

## 2023-09-22 NOTE — Discharge Instructions (Addendum)
Advised patient to take medication as directed with food to completion.  Encouraged to increase daily water intake to 64 ounces per day while taking his medication.  Advised patient to monitor blood pressure daily in the morning prior to eating breakfast.  Advised patient to log measurements for the next 10 to 14 days and if remains elevated please follow-up with PCP to better evaluate daily blood pressure trends.  Advised if symptoms worsen and/or unresolved please follow-up with PCP or here for further evaluation.

## 2023-09-22 NOTE — ED Triage Notes (Signed)
Pt reports concern for abscess/boil to R axilla. States Friday he popped a pimple to the area, and since then has had a "boil" forming. This has grown in size; has had pain, as especially with ROM.

## 2023-09-22 NOTE — ED Provider Notes (Signed)
[facility]    CSN: [id number] Arrival date & time: 09/22/23  0809      History   Chief Complaint No chief complaint on file.   HPI Sean Schmidt is a 22 y.o. male.   HPI 22 year old male presents with concern of abscess/boil to right axilla.  PMH significant for morbid obesity, cellulitis of the left axilla, and ADHD.  Past Medical History:  Diagnosis Date   ADHD (attention deficit hyperactivity disorder)     Patient Active Problem List   Diagnosis Date Noted   Allergic reaction 10/13/2017   Cellulitis of left axilla 10/13/2017   ADHD (attention deficit hyperactivity disorder) 08/02/2013    Past Surgical History:  Procedure Laterality Date   WISDOM TOOTH EXTRACTION Bilateral        Home Medications    Prior to Admission medications   Medication Sig Start Date End Date Taking? Authorizing Provider  sulfamethoxazole-trimethoprim (BACTRIM DS) 800-160 MG tablet Take 1 tablet by mouth 2 (two) times daily for 10 days. 09/22/23 10/02/23 Yes Trevor Iha, FNP  Aluminum Chloride POWD Apply to axilla once daily for perspiration control. 10/13/17   Mliss Sax, MD  cyclobenzaprine (FLEXERIL) 10 MG tablet Take 1 tablet (10 mg total) by mouth 2 (two) times daily as needed for muscle spasms. 11/19/21   Tomi Bamberger, PA-C  hydrocortisone (ANUSOL-HC) 25 MG suppository Place 1 suppository (25 mg total) rectally 2 (two) times daily. 05/05/22   Burchette, Elberta Fortis, MD  naproxen (NAPROSYN) 500 MG tablet Take 1 tablet (500 mg total) by mouth 2 (two) times daily. 11/19/21   Tomi Bamberger, PA-C  ondansetron (ZOFRAN-ODT) 8 MG disintegrating tablet Take 1 tablet (8 mg total) by mouth every 8 (eight) hours as needed for nausea or vomiting. 02/10/21   Evern Core, PA-C    Family History Family History  Problem Relation Age of Onset   Hypertension Mother    Hypertension Father    Asthma Sister     Social History Social History   Tobacco Use    Smoking status: Never   Smokeless tobacco: Never  Vaping Use   Vaping status: Never Used  Substance Use Topics   Alcohol use: No   Drug use: No     Allergies   Patient has no known allergies.   Review of Systems Review of Systems  Skin:        Abscess of right axilla     Physical Exam Triage Vital Signs ED Triage Vitals  Encounter Vitals Group     BP 09/22/23 0833 (!) 178/88     Systolic BP Percentile --      Diastolic BP Percentile --      Pulse Rate 09/22/23 0833 77     Resp 09/22/23 0833 18     Temp 09/22/23 0833 98.2 F (36.8 C)     Temp Source 09/22/23 0833 Oral     SpO2 09/22/23 0833 98 %     Weight --      Height --      Head Circumference --      Peak Flow --      Pain Score 09/22/23 0832 6     Pain Loc --      Pain Education --      Exclude from Growth Chart --    No data found.  Updated Vital Signs BP (!) 148/94   Pulse 77   Temp 98.2 F (36.8 C) (Oral)   Resp 18  SpO2 98%    Physical Exam Vitals and nursing note reviewed.  Constitutional:      Appearance: Normal appearance. He is normal weight.  HENT:     Head: Normocephalic and atraumatic.     Mouth/Throat:     Mouth: Mucous membranes are moist.     Pharynx: Oropharynx is clear.  Eyes:     Conjunctiva/sclera: Conjunctivae normal.     Pupils: Pupils are equal, round, and reactive to light.  Cardiovascular:     Rate and Rhythm: Normal rate and regular rhythm.     Pulses: Normal pulses.     Heart sounds: Normal heart sounds.     Comments: Hypertensive (third manual blood pressure left arm seated 148/94) Pulmonary:     Effort: Pulmonary effort is normal.     Breath sounds: Normal breath sounds. No wheezing, rhonchi or rales.  Musculoskeletal:        General: Normal range of motion.     Cervical back: Normal range of motion and neck supple.  Skin:    General: Skin is warm and dry.     Comments: Right axilla: Cutaneous abscess noted-please see image below  Neurological:      General: No focal deficit present.     Mental Status: He is alert and oriented to person, place, and time. Mental status is at baseline.  Psychiatric:        Mood and Affect: Mood normal.        Behavior: Behavior normal.      UC Treatments / Results  Labs (all labs ordered are listed, but only abnormal results are displayed) Labs Reviewed - No data to display  EKG   Radiology No results found.  Procedures Procedures (including critical care time)  Medications Ordered in UC Medications - No data to display  Initial Impression / Assessment and Plan / UC Course  I have reviewed the triage vital signs and the nursing notes.  Pertinent labs & imaging results that were available during my care of the patient were reviewed by me and considered in my medical decision making (see chart for details).     MDM: 1.  Abscess of right axilla-Rx'd Bactrim 800/160 mg tablet: Take 1 tablet twice daily x 10 days; 2.  Elevated blood-pressure reading in office without diagnosis of hypertension-Advised patient to take medication as directed with food to completion.  Encouraged to increase daily water intake to 64 ounces per day while taking his medication.  Advised patient to monitor blood pressure daily in the morning prior to eating breakfast.  Advised patient to log measurements for the next 10 to 14 days and if remains elevated please follow-up with PCP to better evaluate daily blood pressure trends.  Advised if symptoms worsen and/or unresolved please follow-up with PCP or here for further evaluation.  Patient discharged home, hemodynamically stable. Final Clinical Impressions(s) / UC Diagnoses   Final diagnoses:  Abscess of right axilla  Elevated blood pressure reading in office without diagnosis of hypertension     Discharge Instructions      Advised patient to take medication as directed with food to completion.  Encouraged to increase daily water intake to 64 ounces per day while  taking his medication.  Advised patient to monitor blood pressure daily in the morning prior to eating breakfast.  Advised patient to log measurements for the next 10 to 14 days and if remains elevated please follow-up with PCP to better evaluate daily blood pressure trends.  Advised if symptoms  worsen and/or unresolved please follow-up with PCP or here for further evaluation.     ED Prescriptions     Medication Sig Dispense Auth. Provider   sulfamethoxazole-trimethoprim (BACTRIM DS) 800-160 MG tablet Take 1 tablet by mouth 2 (two) times daily for 10 days. 20 tablet Trevor Iha, FNP      PDMP not reviewed this encounter.   Trevor Iha, FNP 09/22/23 [phone]

## 2024-01-06 ENCOUNTER — Telehealth: Payer: Self-pay | Admitting: Family Medicine

## 2024-02-10 ENCOUNTER — Encounter: Payer: Self-pay | Admitting: Family Medicine

## 2024-02-10 ENCOUNTER — Ambulatory Visit (INDEPENDENT_AMBULATORY_CARE_PROVIDER_SITE_OTHER): Payer: No Typology Code available for payment source | Admitting: Family Medicine

## 2024-02-10 VITALS — BP 130/62 | HR 84 | Temp 98.1°F | Ht 69.29 in | Wt 253.1 lb

## 2024-02-10 DIAGNOSIS — R748 Abnormal levels of other serum enzymes: Secondary | ICD-10-CM | POA: Diagnosis not present

## 2024-02-10 DIAGNOSIS — E559 Vitamin D deficiency, unspecified: Secondary | ICD-10-CM | POA: Diagnosis not present

## 2024-02-10 DIAGNOSIS — R635 Abnormal weight gain: Secondary | ICD-10-CM | POA: Diagnosis not present

## 2024-02-10 DIAGNOSIS — Z0000 Encounter for general adult medical examination without abnormal findings: Secondary | ICD-10-CM | POA: Diagnosis not present

## 2024-02-10 DIAGNOSIS — R7303 Prediabetes: Secondary | ICD-10-CM

## 2024-02-10 LAB — CBC WITH DIFFERENTIAL/PLATELET
Basophils Absolute: 0 10*3/uL (ref 0.0–0.1)
Basophils Relative: 0.5 % (ref 0.0–3.0)
Eosinophils Absolute: 0.1 10*3/uL (ref 0.0–0.7)
Eosinophils Relative: 2.7 % (ref 0.0–5.0)
HCT: 44.4 % (ref 39.0–52.0)
Hemoglobin: 14.5 g/dL (ref 13.0–17.0)
Lymphocytes Relative: 59.3 % — ABNORMAL HIGH (ref 12.0–46.0)
Lymphs Abs: 2.6 10*3/uL (ref 0.7–4.0)
MCHC: 32.8 g/dL (ref 30.0–36.0)
MCV: 87 fl (ref 78.0–100.0)
Monocytes Absolute: 0.4 10*3/uL (ref 0.1–1.0)
Monocytes Relative: 8.5 % (ref 3.0–12.0)
Neutro Abs: 1.3 10*3/uL — ABNORMAL LOW (ref 1.4–7.7)
Neutrophils Relative %: 29 % — ABNORMAL LOW (ref 43.0–77.0)
Platelets: 176 10*3/uL (ref 150.0–400.0)
RBC: 5.1 Mil/uL (ref 4.22–5.81)
RDW: 13.8 % (ref 11.5–15.5)
WBC: 4.4 10*3/uL (ref 4.0–10.5)

## 2024-02-10 LAB — BASIC METABOLIC PANEL
BUN: 13 mg/dL (ref 6–23)
CO2: 29 meq/L (ref 19–32)
Calcium: 9.6 mg/dL (ref 8.4–10.5)
Chloride: 101 meq/L (ref 96–112)
Creatinine, Ser: 0.95 mg/dL (ref 0.40–1.50)
GFR: 111.87 mL/min (ref >60.00)
Glucose, Bld: 114 mg/dL — ABNORMAL HIGH (ref 70–99)
Potassium: 3.7 meq/L (ref 3.5–5.1)
Sodium: 137 meq/L (ref 135–145)

## 2024-02-10 LAB — LIPID PANEL
Cholesterol: 203 mg/dL — ABNORMAL HIGH (ref 0–200)
HDL: 41.6 mg/dL (ref >39.00)
LDL Cholesterol: 145 mg/dL — ABNORMAL HIGH (ref 0–99)
NonHDL: 161.5
Total CHOL/HDL Ratio: 5
Triglycerides: 84 mg/dL (ref 0.0–149.0)
VLDL: 16.8 mg/dL (ref 0.0–40.0)

## 2024-02-10 LAB — HEPATIC FUNCTION PANEL
ALT: 60 U/L — ABNORMAL HIGH (ref 0–53)
AST: 44 U/L — ABNORMAL HIGH (ref 0–37)
Albumin: 4.7 g/dL (ref 3.5–5.2)
Alkaline Phosphatase: 68 U/L (ref 39–117)
Bilirubin, Direct: 0.1 mg/dL (ref 0.0–0.3)
Total Bilirubin: 0.7 mg/dL (ref 0.2–1.2)
Total Protein: 7.8 g/dL (ref 6.0–8.3)

## 2024-02-10 LAB — VITAMIN D 25 HYDROXY (VIT D DEFICIENCY, FRACTURES): VITD: 14.97 ng/mL — ABNORMAL LOW (ref 30.00–100.00)

## 2024-02-10 LAB — HEMOGLOBIN A1C: Hgb A1c MFr Bld: 6.3 % (ref 4.6–6.5)

## 2024-02-10 NOTE — Progress Notes (Signed)
 Established Patient Office Visit  Subjective   Patient ID: Sean Schmidt, male    DOB: 2001/10/31  Age: 23 y.o. MRN: [id number]  Chief Complaint  Patient presents with   Annual Exam    HPI   Sean Schmidt is here for physical exam.  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  During the past year, he gained substantial amount of weight up to 285 pounds at his peak.  Back down to 248 pounds by his home scales.  He has scaled back overall calories.  Still not exercising much because of his schedule.  In looking back his weight was 208 pounds back in 2021.  Generally healthy.  He is concerned about family history of hypertension.  He does have history of vitamin D deficiency.  Takes no regular medications.  Health maintenance reviewed:  Health Maintenance  Topic Date Due   HIV Screening  Never done   INFLUENZA VACCINE  06/25/2023   COVID-19 Vaccine (3 - 2024-25 season) 07/26/2023   DTaP/Tdap/Td (8 - Td or Tdap) 11/02/2030   HPV VACCINES  Completed   Hepatitis C Screening  Completed   Social history-single.  He finished masters degree in public history.  Work as above.  Previously vaped some but no longer.  Only rare alcohol use.  Family history-both parents have hypertension.  He thinks one of them may have prediabetes.  Sister with asthma.  No cancer.  No family history of premature CAD.  Past Medical History:  Diagnosis Date   ADHD (attention deficit hyperactivity disorder)    Past Surgical History:  Procedure Laterality Date   WISDOM TOOTH EXTRACTION Bilateral     reports that he has never smoked. He has never used smokeless tobacco. He reports that he does not drink alcohol and does not use drugs. family history includes Asthma in his sister; Hypertension in his father and mother. No Known Allergies  Review of Systems  Constitutional:  Negative for chills, fever,  malaise/fatigue and weight loss.  HENT:  Negative for hearing loss.   Eyes:  Negative for blurred vision and double vision.  Respiratory:  Negative for cough and shortness of breath.   Cardiovascular:  Negative for chest pain, palpitations and leg swelling.  Gastrointestinal:  Negative for abdominal pain, blood in stool, constipation and diarrhea.  Genitourinary:  Negative for dysuria.  Skin:  Negative for rash.  Neurological:  Negative for dizziness, speech change, seizures, loss of consciousness and headaches.  Psychiatric/Behavioral:  Negative for depression.       Objective:     BP 130/62 (BP Location: Left Arm, Cuff Size: Large)   Pulse 84   Temp 98.1 F (36.7 C) (Oral)   Ht 5' 9.29" (1.76 m)   Wt 253 lb 1.6 oz (114.8 kg)   SpO2 98%   BMI 37.06 kg/m  BP Readings from Last 3 Encounters:  02/10/24 130/62  09/22/23 (!) 148/94  05/05/22 120/80   Wt Readings from Last 3 Encounters:  02/10/24 253 lb 1.6 oz (114.8 kg)  05/05/22 248 lb 3.2 oz (112.6 kg)  12/03/21 249 lb 14.4 oz (113.4 kg)      Physical Exam Vitals reviewed.  Constitutional:      General: He is not in acute distress.    Appearance: He is well-developed.  HENT:     Head: Normocephalic and atraumatic.     Right Ear:  External ear normal.     Left Ear: External ear normal.  Eyes:     Pupils: Pupils are equal, round, and reactive to light.  Neck:     Thyroid: No thyromegaly.  Cardiovascular:     Rate and Rhythm: Normal rate and regular rhythm.     Heart sounds: Normal heart sounds. No murmur heard. Pulmonary:     Effort: No respiratory distress.     Breath sounds: No wheezing or rales.  Abdominal:     General: Bowel sounds are normal. There is no distension.     Palpations: Abdomen is soft. There is no mass.     Tenderness: There is no abdominal tenderness. There is no guarding or rebound.  Musculoskeletal:     Cervical back: Normal range of motion and neck supple.     Right lower leg: No edema.      Left lower leg: No edema.  Lymphadenopathy:     Cervical: No cervical adenopathy.  Skin:    Findings: No rash.  Neurological:     Mental Status: He is alert and oriented to person, place, and time.     Cranial Nerves: No cranial nerve deficit.      No results found for any visits on 02/10/24.    The ASCVD Risk score (Arnett DK, et al., 2019) failed to calculate for the following reasons:   The 2019 ASCVD risk score is only valid for ages 6 to 59    Assessment & Plan:   Problem List Items Addressed This Visit   None Visit Diagnoses       Physical exam    -  Primary   Relevant Orders   Basic metabolic panel   Lipid panel   CBC with Differential/Platelet   Hepatic function panel   VITAMIN D 25 Hydroxy (Vit-D Deficiency, Fractures)   Hemoglobin A1c     Vitamin D deficiency         Weight gain       Relevant Orders   Hemoglobin A18c     23 year old male with past medical history as above.  Weight gain during the past year but he has made some substantial changes already and is already lost about 30 pounds.  We have encouraged him to continue with lifestyle modification.  Talked about importance of regular exercise.  Consider calorie tracking app.  Recommend try to get weight down around 190 to 200 pounds ideally.  -Obtain labs as above -Tetanus up-to-date -No other immunizations indicated at this time. -Recommend over-the-counter vitamin D supplement 2000 international units daily  No follow-ups on file.    Evelena Peat, MD

## 2024-02-11 NOTE — Addendum Note (Signed)
 Addended by: Christy Sartorius on: 02/11/2024 11:48 AM   Modules accepted: Orders

## 2024-09-04 ENCOUNTER — Other Ambulatory Visit: Payer: Self-pay

## 2024-09-04 ENCOUNTER — Ambulatory Visit
Admission: RE | Admit: 2024-09-04 | Discharge: 2024-09-04 | Disposition: A | Discharge status: Home / Self Care | Source: Ambulatory Visit | Attending: Family Medicine | Admitting: Family Medicine

## 2024-09-04 VITALS — BP 157/81 | HR 86 | Temp 97.8°F | Resp 16

## 2024-09-04 DIAGNOSIS — H6002 Abscess of left external ear: Secondary | ICD-10-CM | POA: Diagnosis not present

## 2024-09-04 MED ORDER — DOXYCYCLINE HYCLATE 100 MG PO CAPS: 100.0000 mg | ORAL_CAPSULE | Freq: Two times a day (BID) | ORAL | 0 refills | Status: AC

## 2024-09-04 NOTE — ED Triage Notes (Signed)
 Right ear pain when pressing on bottom of ear, has been going on for 1 week. Has not had otc meds.

## 2024-09-04 NOTE — Discharge Instructions (Signed)
 Take Tylenol or ibuprofen if needed for pain Take antibiotic 2 times a day.  Take 2 doses today.  Take this antibiotic with food Return if needed

## 2024-09-04 NOTE — ED Provider Notes (Signed)
 [facility]    CSN: [id number] Arrival date & time: 09/04/24  1126      History   Chief Complaint Chief Complaint  Patient presents with   Ear Fullness    HPI Sean Schmidt is a 23 y.o. male.   HPI  \Patient states he has a sore ear.  Right ear is sore.  Hurts to touch around the pinna.  It hurts when he tries to sleep on that side.  No problems with hearing.  No cough or cold symptoms.  No recent swimming or diving  Past Medical History:  Diagnosis Date   ADHD (attention deficit hyperactivity disorder)     Patient Active Problem List   Diagnosis Date Noted   Allergic reaction 10/13/2017   Cellulitis of left axilla 10/13/2017   ADHD (attention deficit hyperactivity disorder) 08/02/2013    Past Surgical History:  Procedure Laterality Date   WISDOM TOOTH EXTRACTION Bilateral        Home Medications    Prior to Admission medications   Medication Sig Start Date End Date Taking? Authorizing Provider  doxycycline (VIBRAMYCIN) 100 MG capsule Take 1 capsule (100 mg total) by mouth 2 (two) times daily. 09/04/24  Yes Maranda Jamee Jacob, MD    Family History Family History  Problem Relation Age of Onset   Hypertension Mother    Hypertension Father    Asthma Sister     Social History Social History   Tobacco Use   Smoking status: Never   Smokeless tobacco: Never  Vaping Use   Vaping status: Never Used  Substance Use Topics   Alcohol use: No   Drug use: No     Allergies   Patient has no known allergies.   Review of Systems Review of Systems See HPI  Physical Exam Triage Vital Signs ED Triage Vitals  Encounter Vitals Group     BP 09/04/24 1135 (!) 157/81     Girls Systolic BP Percentile --      Girls Diastolic BP Percentile --      Boys Systolic BP Percentile --      Boys Diastolic BP Percentile --      Pulse Rate 09/04/24 1135 86     Resp 09/04/24 1135 16     Temp 09/04/24 1135 97.8 F (36.6 C)     Temp src --      SpO2  09/04/24 1135 97 %     Weight --      Height --      Head Circumference --      Peak Flow --      Pain Score 09/04/24 1138 2     Pain Loc --      Pain Education --      Exclude from Growth Chart --    No data found.  Updated Vital Signs BP (!) 157/81   Pulse 86   Temp 97.8 F (36.6 C)   Resp 16   SpO2 97%      Physical Exam Constitutional:      General: He is not in acute distress.    Appearance: He is well-developed.  HENT:     Head: Normocephalic and atraumatic.     Ears:   Eyes:     Conjunctiva/sclera: Conjunctivae normal.     Pupils: Pupils are equal, round, and reactive to light.  Cardiovascular:     Rate and Rhythm: Normal rate.  Pulmonary:     Effort: Pulmonary effort is normal. No respiratory  distress.  Musculoskeletal:        General: Normal range of motion.     Cervical back: Normal range of motion.  Skin:    General: Skin is warm and dry.  Neurological:     Mental Status: He is alert.      UC Treatments / Results  Labs (all labs ordered are listed, but only abnormal results are displayed) Labs Reviewed - No data to display  EKG   Radiology No results found.  Procedures Procedures (including critical care time)  Medications Ordered in UC Medications - No data to display  Initial Impression / Assessment and Plan / UC Course  I have reviewed the triage vital signs and the nursing notes.  Pertinent labs & imaging results that were available during my care of the patient were reviewed by me and considered in my medical decision making (see chart for details).     Final Clinical Impressions(s) / UC Diagnoses   Final diagnoses:  Abscess, ear canal, left     Discharge Instructions      Take Tylenol or ibuprofen if needed for pain Take antibiotic 2 times a day.  Take 2 doses today.  Take this antibiotic with food Return if needed   ED Prescriptions     Medication Sig Dispense Auth. Provider   doxycycline (VIBRAMYCIN) 100 MG  capsule Take 1 capsule (100 mg total) by mouth 2 (two) times daily. 14 capsule Maranda Jamee Jacob, MD      PDMP not reviewed this encounter.   Maranda Jamee Jacob, MD 09/04/24 [phone]

## 2024-09-05 ENCOUNTER — Telehealth: Payer: Self-pay

## 2024-09-05 NOTE — Telephone Encounter (Signed)
 [email or web address]
